# Patient Record
Sex: Male | Born: 1960
Health system: Southern US, Community
[De-identification: ages and names within clinical notes are randomized; demographics above are authoritative.]

---

## 2004-11-17 ENCOUNTER — Emergency Department: Payer: Self-pay | Admitting: Emergency Medicine
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
## 2019-06-30 ENCOUNTER — Ambulatory Visit: Payer: Self-pay | Attending: Internal Medicine

## 2019-06-30 DIAGNOSIS — Z20822 Contact with and (suspected) exposure to covid-19: Secondary | ICD-10-CM | POA: Insufficient documentation

## 2019-07-01 ENCOUNTER — Ambulatory Visit: Payer: Self-pay | Admitting: Family Medicine

## 2019-07-01 LAB — NOVEL CORONAVIRUS, NAA: SARS-CoV-2, NAA: NOT DETECTED

## 2019-07-07 DIAGNOSIS — R030 Elevated blood-pressure reading, without diagnosis of hypertension: Secondary | ICD-10-CM | POA: Diagnosis not present

## 2019-07-07 DIAGNOSIS — U071 COVID-19: Secondary | ICD-10-CM | POA: Diagnosis not present

## 2019-07-24 LAB — LIPID PANEL
Cholesterol: 161 (ref 0–200)
HDL: 60 (ref 35–70)
LDL Cholesterol: 73
LDl/HDL Ratio: 3.1
Triglycerides: 237 — AB (ref 40–160)
Triglycerides: 237 — AB (ref 40–160)

## 2019-07-31 ENCOUNTER — Ambulatory Visit: Payer: Self-pay | Admitting: Internal Medicine

## 2019-08-04 NOTE — Progress Notes (Signed)
Patient ID: Gregory Salinas, male    DOB: 05/05/1961, 59 y.o.   MRN: [id number]  PCP: Jamelle Haring, MD  Chief Complaint  Patient presents with  . New Patient (Initial Visit)  . Establish Care  . Hypertension    Subjective:   Gregory Salinas is a 59 y.o. male, presents to clinic with CC of the following:  Chief Complaint  Patient presents with  . New Patient (Initial Visit)  . Establish Care  . Hypertension    HPI:  Patient is a 59 year old male who presents today new to the practice. He has a history of hypertension and hyperlipidemia, and also a knee injury (hyperextension) when much younger on limited documentation available to review from his PMH.  HTN  Reviewed medication regimen, on zestoretic 20-25 mg daily. Is taking BP med and not missing doses  BP Readings from Last 3 Encounters:  08/05/19 (!) 156/90   Has a clinic at work again, at [employer]. Was closed down past year before reopening again.  They have been helping to follow his blood pressures in the past. Has checked BP's at home and running in the 160/100  range on home checks. He brought in his monitor, and on checking in the office, it tends to run about 10 mm of both what we were getting on manual check today.  The home readings are still higher than desired. Weight has in general remained stable, increased some in recent past as noted below No CP, SOB, marked fatigue, increased HA's, vision changes, LE swelling Has eye exam yearly.   Diet  - trying to eat healthy at times (DASH diet, low sodium intake discussed),he likes butter, 3 monster drinks a week noted and trying to cut back. Physical activity level - minimal, walk the plant, no regular exercise   Hyperlipidemia: Reviewed Current Medication -  on a statin, Zocor 80 mg daily Not missing doses.  - Current Diet: not adhere to very strict diet rec's for limiting fats - No recent chest pain, shortness of breath, myalgias. Last  Lipids: Triglycerides 237, LDL 75 on check in March, 5366  Obesity Wt Readings from Last 3 Encounters:  08/05/19 203 lb 1.6 oz (92.1 kg)  weight around 195 past 10 years+, a few pounds up recent past   Possible sleep apnea -his wife is concerned as on a note with his records, she noted Gregory Salinas has many apneas each night, lasting at least 10+ seconds to over 1 minute with very little time between them for at least 10 or more minutes or until he changes position.  She noted she is concerned, but he refuses to get a sleep study. An Epworth sleepiness scale completed today was 4. He denies any daytime somnolence, wakes up feeling refreshed.  He notes a has had contact with others with sleep apnea, and does not feel he has same symptoms. Did discuss may be surprised if he does have sleep apnea and CPAP initiated how much better he feels and did not realize that.   He still does not want to get a sleep study at this point, and will continue to monitor.  Prediabetes Glucose was 120, A1c was 6.1 on recent check in March.  Denied a history of prediabetes.  Gets up once a night to urinate, and denied frequency, marked fatigue, or marked increased thirst.  Bone spur - posterior upper calf He noted a firm area in his right posterior knee/upper calf region, not painful,  no problems with knee motion, and saw [facility] in 2018 for this, an x-ray was completed which he brought with him and reviewed.  He was told it was a spur/osteophyte with no further recommendations.  He wanted to bring attention today.  He notes he remains relatively asymptomatic. Hyperextended knee with karate test age 68 in his past Using MSN to help with his joints, meloxicam no longer taking and does use an occasional ibuprofen or Tylenol product as needed.  He has been doing pretty well in the recent past.  He noted a right thumb numbness at the base, all the time since Nov 2019 Right handed, no weakness, not dropping things, not  limiting.  He is on his computer often at work, and tries to follow good ergonomics.  Not painful. After had neck pain, pinched nerve was diagnosed in his neck.  Tob - never smoker Alcohol - neg  Colonoscopy hx - 02/05/12, hemorrhoids noted, no other concerns. Prostate history - has had examined in past and ok, PSA's good to date  Due for 2nd Covid shortly   Current Outpatient Medications:  .  Ascorbic Acid (VITAMIN C) 1000 MG tablet, Take 1,000 mg by mouth daily., Disp: , Rfl:  .  aspirin 81 MG chewable tablet, , Disp: , Rfl:  .  B Complex-C (SUPER B COMPLEX PO), , Disp: , Rfl:  .  Cholecalciferol (HM VITAMIN D3) 25 MCG (1000 UT) tablet, Take 1,000 Units by mouth daily., Disp: , Rfl:  .  cyclobenzaprine (FLEXERIL) 10 MG tablet, Take 10 mg by mouth 3 (three) times daily as needed for muscle spasms., Disp: , Rfl:  .  Doxylamine Succinate, Sleep, (SLEEP AID PO), Take 10 mg by mouth. [place], Disp: , Rfl:  .  Glucosamine-Chondroitin-MSM 1500-1200-500 MG PACK, Take by mouth., Disp: , Rfl:  .  meloxicam (MOBIC) 15 MG tablet, Take 15 mg by mouth as needed for pain. As need for knee pain, Disp: , Rfl:  .  Multiple Vitamins-Minerals (MULTIVITAMIN GUMMIES WOMENS PO), , Disp: , Rfl:  .  simvastatin (ZOCOR) 80 MG tablet, Take 80 mg by mouth daily., Disp: , Rfl:  .  traMADol (ULTRAM) 50 MG tablet, , Disp: , Rfl:  .  vitamin E (VITAMIN E) 180 MG (400 UNITS) capsule, Take 800 Units by mouth daily., Disp: , Rfl:  .  lisinopril-hydrochlorothiazide (ZESTORETIC) 20-12.5 MG tablet, Take 2 tablets by mouth daily., Disp: 90 tablet, Rfl: 3   No Known Allergies   History reviewed. No pertinent family history.   Social History   Tobacco Use  . Smoking status: Never Smoker  . Smokeless tobacco: Never Used  Substance Use Topics  . Alcohol use: Never    With staff assistance, above reviewed with the patient today.  ROS:  As per HPI Constitutional: Negative for fever or other Covid concerning  symptoms,  Respiratory: Negative for cough. shortness of breath.   Cardiovascular: Negative for chest pain or palpitations. No increased LE swelling Gastrointestinal: Negative for persistent abdominal pain, no concerning  bowel changes.  No black stools or bleeding per rectum Musculoskeletal: Negative for joint swelling/pains, positive numbness in the right hand, gait problems Skin: Negative for new rash Neurological: Negative for increased headache, no increased numbness/tingling/weakness in the lower extremities  No other specific complaints on sytems review (except as listed in HPI above).   PHQ2/9: Depression screen [facility] 2/9 08/05/2019  Decreased Interest 0  Down, Depressed, Hopeless 0  PHQ - 2 Score 0  Altered sleeping  1  Tired, decreased energy 0  Change in appetite 0  Feeling bad or failure about yourself  0  Trouble concentrating 0  Moving slowly or fidgety/restless 0  Suicidal thoughts 0  PHQ-9 Score 1  Difficult doing work/chores Not difficult at all   PHQ-2/9 Result is neg  Fall Risk: Fall Risk  08/05/2019  Falls in the past year? 0  Number falls in past yr: 0  Injury with Fall? 0      Objective:   Vitals:   08/05/19 0853  BP: (!) 156/90  Pulse: 86  Resp: 20  Temp: (!) 97.1 F (36.2 C)  TempSrc: Temporal  SpO2: 96%  Weight: 203 lb 1.6 oz (92.1 kg)  Height: 5\' 3"  (1.6 m)    Body mass index is 35.98 kg/m.  Physical Exam   NAD, masked, obese HEENT - [place]/AT, sclera anicteric, positive glasses, PERRL, EOMI, conj - non-inj'ed, TM's and canals clear, pharynx clear Neck - supple, no adenopathy, no TM, carotids 2+ and = without bruits bilat Car - RRR without m/g/r Pulm- RR and effort normal at rest, CTA without wheeze or rales Abd - soft, obese, NT, ND, BS+,  no masses, no HSM Back - no CVA tenderness, no spinal tenderness GU -deferred, last PSA was good, no concerning recent symptoms noted. Skin- no rash noted on exposed areas,  Ext - no LE edema, no  active joints Right knee -a firm nodularity was noted posterior to the right knee in the very proximal upper calf region that was nontender, no erythema or overlying skin changes evident, He had good range of motion of his right knee with no discomfort on testing, no right knee swelling. Right hand -good right hand grip strength, good pinch strength with thumb to the little finger, good range of motion of the right thumb, his sensation to light touch in the right hand area was normal and equal to the opposite hand.  No bruising, swelling, erythema.  Right wrist motion was good.  Pulses good distally. Neuro/psychiatric - affect was not flat, appropriate with conversation  Alert and oriented  Grossly non-focal - good strength on testing extremities, sensation intact to LT in distal extremities  Speech and gait are normal    Results for orders placed or performed in visit on 06/30/19  Novel Coronavirus, NAA ([facility])   Specimen: Nasopharyngeal(NP) swabs in vial transport medium   NASOPHARYNGE  TESTING  Result Value Ref Range   SARS-CoV-2, NAA Not Detected Not Detected   He brought a folder of recent lab test done from 07/30/2019.  Abnormalities included a glucose of 120, slightly low sodium and chloride level at 134 and 97, a hemoglobin A1c of 6.1, triglyceride level of 237 with the LDL cholesterol ok at 75, PSA was 1, testosterone was slightly low at 184. TSH was normal at 1.45 and vitamin D level was normal at 65    Assessment & Plan:  1. Essential hypertension Patient's blood pressure has been high on recent readings at home, and he brought a nice table of the blood pressure readings recorded at home over the past month plus.  He also had laboratory tests recently done and reviewed.  Kidney function was normal as part of those labs.  His monitor was compared to readings here today, and was noted to be about 10 mm higher with readings on the monitor versus manual reading.  Noting this, I still  believe the blood pressures at home are above goal, and not as well  controlled.  I do feel that increasing his blood pressure medication is appropriate to try to get better control, and he agreed. Discussed adding a second medicine versus increasing the Zestoretic medicine dose, and agreed to increase the Zestoretic to 40 mg of the lisinopril and continuing the same dose of the hydrochlorothiazide (40-25 mg dose), which will require taking 2 of the 20-12.5 mg tablets daily.  After taking the new dose for approximately 5 to 7 days, will again record readings at home and bring with him at the next visit. Also discussed the importance of weight management and ideally could lose a few pounds encouraged to help with blood pressure control, and also spent time discussing diet recommendations and increasing physical activity levels as well. - lisinopril-hydrochlorothiazide (ZESTORETIC) 20-12.5 MG tablet; Take 2 tablets by mouth daily.  Dispense: 90 tablet; Refill: 3  2. Mixed hyperlipidemia We will continue the Zocor, with a goal of an LDL < 70 felt best. As above, dietary recommendations and increasing physical activity levels were discussed to help as well.  3. Class 2 severe obesity due to excess calories with serious comorbidity and body mass index (BMI) of 35.0 to 35.9 in adult [facility](HCC) As above, I did discuss the importance of weight management to help with the conditions noted today.  Ideally, even losing a few pounds would be encouraged and helpful.  4. Prediabetes Educated, and did include information on this in the AVS Again, dietary recommendations and increasing physical activity levels can be helpful and were discussed. Did mention the possibility of Metformin and its use and prediabetes, although he very much did not want to start another medicine like Metformin today and would prefer the lifestyle modifications in helping to prevent progression to diabetes presently.  5. Bone spur history Do  feel of the area in the upper right calf is benign, and may be a spurring/osteophyte type process, or possibly could be calcification of the soft tissue in this area like a myositis ossificans.  It is not new, and has not changed significantly in the recent past, and is not causing him symptoms presently.  We will continue to monitor, and it does become more problematic over time, will repeat an x-ray at that time.  6. Neuropathy of right hand -mild Discussed the possible sources of his right hand symptoms, which are not marked or limiting at this point, and he still has good very good strength. This has been present since November 2019 per his report. Recommended trying to avoid any prolonged positions in the extremes of wrist flexion and extension, and will hold off on formal splints at this time, often start with night splints, as he to immobilize the wrist significantly due to some muscle atrophy and weakness that can develop over time.  May pursue if symptoms become more problematic.  Also may pursue further studies.  Emphasized the importance of good ergonomics treated with his computer at work given his frequent use of that presently.  7. Hemorrhoids, unspecified hemorrhoid type Noted on the prior colonoscopy.  No major concerns clinically noted.  8.  Possible sleep apnea His Epworth sleepiness scale was in the normal range (four), and he denied marked symptoms of not feeling refreshed after sleeping or having daytime somnolence.  His wife echoed concerns with witnessing apnea when he sleeps.  We discussed the potential of a sleep study, and now they can be done at home, and the potential likelihood that he has sleep apnea also discussed.  I noted if  he does have sleep apnea, it would be beneficial to diagnose and treat to lessen potential exacerbation of health concerns in the future.  He was not interested in pursuing the testing at this point.    He will follow up in approximately 6 weeks  time, sooner as needed. He did bring a folder with all tests to review, and that was very helpful for this visit. The folder was returned to him, and asked that he hang onto this folder for any future reference needs.   Jamelle Haring, MD 08/05/19 10:52 AM

## 2019-08-05 ENCOUNTER — Ambulatory Visit (INDEPENDENT_AMBULATORY_CARE_PROVIDER_SITE_OTHER): Payer: BC Managed Care – PPO | Admitting: Internal Medicine

## 2019-08-05 ENCOUNTER — Other Ambulatory Visit: Payer: Self-pay

## 2019-08-05 ENCOUNTER — Encounter: Payer: Self-pay | Admitting: Internal Medicine

## 2019-08-05 VITALS — BP 156/90 | HR 86 | Temp 97.1°F | Resp 20 | Ht 63.0 in | Wt 203.1 lb

## 2019-08-05 DIAGNOSIS — E782 Mixed hyperlipidemia: Secondary | ICD-10-CM | POA: Diagnosis not present

## 2019-08-05 DIAGNOSIS — R7303 Prediabetes: Secondary | ICD-10-CM

## 2019-08-05 DIAGNOSIS — M779 Enthesopathy, unspecified: Secondary | ICD-10-CM

## 2019-08-05 DIAGNOSIS — K649 Unspecified hemorrhoids: Secondary | ICD-10-CM | POA: Insufficient documentation

## 2019-08-05 DIAGNOSIS — Z6835 Body mass index (BMI) 35.0-35.9, adult: Secondary | ICD-10-CM

## 2019-08-05 DIAGNOSIS — G5691 Unspecified mononeuropathy of right upper limb: Secondary | ICD-10-CM | POA: Insufficient documentation

## 2019-08-05 DIAGNOSIS — I10 Essential (primary) hypertension: Secondary | ICD-10-CM

## 2019-08-05 HISTORY — DX: Unspecified hemorrhoids: K64.9

## 2019-08-05 MED ORDER — LISINOPRIL-HYDROCHLOROTHIAZIDE 20-12.5 MG PO TABS: 2.0000 | ORAL_TABLET | Freq: Every day | ORAL | 3 refills | Status: DC

## 2019-08-05 NOTE — Patient Instructions (Signed)
Prediabetes Prediabetes is the condition of having a blood sugar (blood glucose) level that is higher than it should be, but not high enough for you to be diagnosed with type 2 diabetes. Having prediabetes puts you at risk for developing type 2 diabetes (type 2 diabetes mellitus). Prediabetes may be called impaired glucose tolerance or impaired fasting glucose. Prediabetes usually does not cause symptoms. Your health care provider can diagnose this condition with blood tests. You may be tested for prediabetes if you are overweight and if you have at least one other risk factor for prediabetes. What is blood glucose, and how is it measured? Blood glucose refers to the amount of glucose in your bloodstream. Glucose comes from eating foods that contain sugars and starches (carbohydrates), which the body breaks down into glucose. Your blood glucose level may be measured in mg/dL (milligrams per deciliter) or mmol/L (millimoles per liter). Your blood glucose may be checked with one or more of the following blood tests:  A fasting blood glucose (FBG) test. You will not be allowed to eat (you will fast) for 8 hours or longer before a blood sample is taken. ? A normal range for FBG is 70-100 mg/dl (3.9-5.6 mmol/L).  An A1c (hemoglobin A1c) blood test. This test provides information about blood glucose control over the previous 2?3months.  An oral glucose tolerance test (OGTT). This test measures your blood glucose at two times: ? After fasting. This is your baseline level. ? Two hours after you drink a beverage that contains glucose. You may be diagnosed with prediabetes:  If your FBG is 100?125 mg/dL (5.6-6.9 mmol/L).  If your A1c level is 5.7?6.4%.  If your OGTT result is 140?199 mg/dL (7.8-11 mmol/L). These blood tests may be repeated to confirm your diagnosis. How can this condition affect me? The pancreas produces a hormone (insulin) that helps to move glucose from the bloodstream into cells.  When cells in the body do not respond properly to insulin that the body makes (insulin resistance), excess glucose builds up in the blood instead of going into cells. As a result, high blood glucose (hyperglycemia) can develop, which can cause many complications. Hyperglycemia is a symptom of prediabetes. Having high blood glucose for a long time is dangerous. Too much glucose in your blood can damage your nerves and blood vessels. Long-term damage can lead to complications from diabetes, which may include:  Heart disease.  Stroke.  Blindness.  Kidney disease.  Depression.  Poor circulation in the feet and legs, which could lead to surgical removal (amputation) in severe cases. What can increase my risk? Risk factors for prediabetes include:  Having a family member with type 2 diabetes.  Being overweight or obese.  Being older than age 45.  Being of American Indian, African-American, Hispanic/Latino, or Asian/Pacific Islander descent.  Having an inactive (sedentary) lifestyle.  Having a history of heart disease.  History of gestational diabetes or polycystic ovary syndrome (PCOS), in women.  Having low levels of good cholesterol (HDL-C) or high levels of blood fats (triglycerides).  Having high blood pressure. What actions can I take to prevent diabetes?      Be physically active. ? Do moderate-intensity physical activity for 30 or more minutes on 5 or more days of the week, or as much as told by your health care provider. This could be brisk walking, biking, or water aerobics. ? Ask your health care provider what activities are safe for you. A mix of physical activities may be best, such as   walking, swimming, cycling, and strength training.  Lose weight as told by your health care provider. ? Losing 5-7% of your body weight can reverse insulin resistance. ? Your health care provider can determine how much weight loss is best for you and can help you lose weight  safely.  Follow a healthy meal plan. This includes eating lean proteins, complex carbohydrates, fresh fruits and vegetables, low-fat dairy products, and healthy fats. ? Follow instructions from your health care provider about eating or drinking restrictions. ? Make an appointment to see a diet and nutrition specialist (registered dietitian) to help you create a healthy eating plan that is right for you.  Do not smoke or use any tobacco products, such as cigarettes, chewing tobacco, and e-cigarettes. If you need help quitting, ask your health care provider.  Take over-the-counter and prescription medicines as told by your health care provider. You may be prescribed medicines that help lower the risk of type 2 diabetes.  Keep all follow-up visits as told by your health care provider. This is important. Summary  Prediabetes is the condition of having a blood sugar (blood glucose) level that is higher than it should be, but not high enough for you to be diagnosed with type 2 diabetes.  Having prediabetes puts you at risk for developing type 2 diabetes (type 2 diabetes mellitus).  To help prevent type 2 diabetes, make lifestyle changes such as being physically active and eating a healthy diet. Lose weight as told by your health care provider. This information is not intended to replace advice given to you by your health care provider. Make sure you discuss any questions you have with your health care provider. Document Revised: 08/30/2018 Document Reviewed: 06/29/2015 [employer] Patient Education  2020 [employer]  

## 2019-09-15 NOTE — Progress Notes (Signed)
Patient ID: Gregory Salinas, male    DOB: Aug 24, 1960, 59 y.o.   MRN: [id number]  PCP: Jamelle Haring, MD  Chief Complaint  Patient presents with  . Hypertension    follow up    Subjective:   Gregory Salinas is a 59 y.o. male, presents to clinic with CC of the following:  Chief Complaint  Patient presents with  . Hypertension    follow up    HPI:  Patient is a 59 year old male who presented 08/05/2019 new to the practice. His blood pressure was noted to be not as well controlled last visit, with his blood pressure medicine slightly increased. Follows up today.  HTN medication regimen, on zestoretic 20-12.5 mg - two tabs daily (increased from 20/25 mg dose daily prior) Is taking BP med and not missing doses  BP Readings from Last 3 Encounters:  09/16/19 132/76  08/05/19 (!) 156/90   He has checked BP's at home and brought the recordings on a sheet today with daily readings noted from March 29 to April 26. Have been running in the 140-160/70's-91 range recently.  Last visit when he brought his monitor with him, his home cuff was about 47mm above our reading.  There was a question of the potential cuff size playing a role with that. Also, he noted he has a clinic at work again, at [employer]. Was closed down past year before reopening again.  They had been helping to follow his blood pressures in the past, although he has not returned to them as of yet to check his blood pressure at work.  Weight had increased some in the recent past, stable this past month. Wt Readings from Last 3 Encounters:  09/16/19 204 lb 1.6 oz (92.6 kg)  08/05/19 203 lb 1.6 oz (92.1 kg)   No CP, palp's, SOB, marked fatigue,increased HA's, vision changes, LE swelling  Diet and physical activity levels as noted on last visit.  Hyperlipidemia: Current Medication -  on a statin, Zocor 80 mg daily - No chest pain, shortness of breath, myalgias.  Last Lipids: Triglycerides 237,  LDL 75 on check in March, 5465  Obesity Wt Readings from Last 3 Encounters:  09/16/19 204 lb 1.6 oz (92.6 kg)  08/05/19 203 lb 1.6 oz (92.1 kg)    weight around 195 past 10 years+, a few pounds up recent past and weight stable this past month.   Possible sleep apnea -his wife noted concern as on a note with his records reviewed last visit noted Gregory Salinas has many apneas each night, lasting at least 10+ seconds to over 1 minute with very little time between them for at least 10 or more minutes or until he changes position.  She noted she is concerned, but he has refused to get a sleep study. An Epworth sleepiness scale completed last visit was 4. He continues to deny any daytime somnolence, wakes up feeling refreshed.   He noted previoulsy has had contact with others with sleep apnea, and does not feel he has the same symptoms. Did discuss prior that he may be surprised if he does have sleep apnea and CPAP initiated how much better he feels and did not realize that.   He still does not want to pursue a sleep study at this time.  Prediabetes Glucose was 120, A1c was 6.1 on recent check in March.   Denied a history of prediabetes.  Gets up once a night to urinate, and denied frequency,  marked fatigue, or marked increased thirst.   Tob - never smoker Alcohol - neg  Colonoscopy hx - 02/05/12, hemorrhoids noted, no other concerns        Patient Active Problem List   Diagnosis Date Noted  . Essential hypertension 08/05/2019  . Mixed hyperlipidemia 08/05/2019  . Class 2 severe obesity due to excess calories with serious comorbidity and body mass index (BMI) of 35.0 to 35.9 in adult (HCC) 08/05/2019  . Prediabetes 08/05/2019  . Neuropathy of right hand 08/05/2019  . Hemorrhoids 08/05/2019      Current Outpatient Medications:  .  Ascorbic Acid (VITAMIN C) 1000 MG tablet, Take 1,000 mg by mouth daily., Disp: , Rfl:  .  aspirin 81 MG chewable tablet, , Disp: , Rfl:  .  B  Complex-C (SUPER B COMPLEX PO), , Disp: , Rfl:  .  Cholecalciferol (HM VITAMIN D3) 25 MCG (1000 UT) tablet, Take 1,000 Units by mouth daily., Disp: , Rfl:  .  Doxylamine Succinate, Sleep, (SLEEP AID PO), Take 10 mg by mouth. [place], Disp: , Rfl:  .  Glucosamine-Chondroitin-MSM 1500-1200-500 MG PACK, Take by mouth., Disp: , Rfl:  .  lisinopril-hydrochlorothiazide (ZESTORETIC) 20-12.5 MG tablet, Take 2 tablets by mouth daily., Disp: 90 tablet, Rfl: 3 .  Multiple Vitamins-Minerals (MULTIVITAMIN GUMMIES WOMENS PO), , Disp: , Rfl:  .  simvastatin (ZOCOR) 80 MG tablet, Take 80 mg by mouth daily., Disp: , Rfl:  .  vitamin E (VITAMIN E) 180 MG (400 UNITS) capsule, Take 800 Units by mouth daily., Disp: , Rfl:  .  cyclobenzaprine (FLEXERIL) 10 MG tablet, Take 10 mg by mouth 3 (three) times daily as needed for muscle spasms., Disp: , Rfl:  .  meloxicam (MOBIC) 15 MG tablet, Take 15 mg by mouth as needed for pain. As need for knee pain, Disp: , Rfl:  .  traMADol (ULTRAM) 50 MG tablet, , Disp: , Rfl:    No Known Allergies   No past surgical history on file.   No family history on file.   Social History   Tobacco Use  . Smoking status: Never Smoker  . Smokeless tobacco: Never Used  Substance Use Topics  . Alcohol use: Never    With staff assistance, above reviewed with the patient today.  ROS: As per HPI, otherwise no specific complaints on a limited and focused system review   No results found for this or any previous visit (from the past 72 hour(s)).   PHQ2/9: Depression screen [facility] 2/9 09/16/2019 08/05/2019  Decreased Interest 0 0  Down, Depressed, Hopeless 0 0  PHQ - 2 Score 0 0  Altered sleeping 0 1  Tired, decreased energy 0 0  Change in appetite 0 0  Feeling bad or failure about yourself  0 0  Trouble concentrating 0 0  Moving slowly or fidgety/restless 0 0  Suicidal thoughts 0 0  PHQ-9 Score 0 1  Difficult doing work/chores Not difficult at all Not difficult at all    PHQ-2/9 Result is neg   Fall Risk: Fall Risk  09/16/2019 08/05/2019  Falls in the past year? 0 0  Number falls in past yr: 0 0  Injury with Fall? 0 0  Follow up Falls evaluation completed -      Objective:   Vitals:   09/16/19 0806  BP: 132/76  Pulse: 88  Resp: 16  Temp: (!) 97.3 F (36.3 C)  TempSrc: Temporal  SpO2: 98%  Weight: 204 lb 1.6 oz (92.6 kg)  Height: 5\' 3"  (1.6 m)    Body mass index is 36.15 kg/m. Blood pressure check by me in the office using a large adult cuff: Right arm-150/88 Left arm-155/90  Physical Exam   NAD, masked, pleasant HEENT - [place]/AT, sclera anicteric, conj - non-inj'ed, Neck - supple, carotids 2+ and = without bruits bilat Car - RRR without m/g/r Pulm- RR and effort normal at rest, CTA without wheeze or rales Abd - soft, obese, NT diffusely,  Back - no CVA tenderness Ext - no LE edema,  Neuro/psychiatric - affect was not flat, appropriate with conversation  Alert and oriented  Speech  normal   Results for orders placed or performed in visit on 06/30/19  Novel Coronavirus, NAA ([facility])   Specimen: Nasopharyngeal(NP) swabs in vial transport medium   NASOPHARYNGE  TESTING  Result Value Ref Range   SARS-CoV-2, NAA Not Detected Not Detected         Assessment & Plan:   1. Essential hypertension Noted it is likely his blood pressure is running in the 80-90 range on average, with some concerns it may be running a little higher than the goal of less than 140 systolic.  Ideally, trying to get the blood pressure close to 120/80 is the goal. Noted if trying to get slightly better control, would be more advantageous adding a another medicine then trying to increase the current medicine he is on to a higher dose.  Ill and amlodipine product would be a reasonable medicine to add if was going to. He is very diligent with getting outside blood pressure measurements to help with data points in decision making, and noting may be slightly higher  than what we have been getting in the office is 1 consideration. Mutually agreed to follow-up and get a blood pressure check with his clinic at work this Thursday as he was planning, and seeing how their blood pressure check compared with his home readings as well.  We will also continue to have home blood pressure checks as he is doing on his current medication dose. We will follow up with a phone communication in a couple weeks time, and pending further data points received, may decide to add add amlodipine product.  He was understanding of this and in agreement with this approach. He did just have labs in March, and no need to repeat presently - lisinopril-hydrochlorothiazide (ZESTORETIC) 20-12.5 MG tablet; Take 2 tablets by mouth daily.  Dispense: 180 tablet; Refill: 3  2. Mixed hyperlipidemia Last lipid panel from March was reviewed, and remains on a statin presently.  3. Class 2 severe obesity due to excess calories with serious comorbidity and body mass index (BMI) of 35.0 to 35.9 in adult (HCC) Weight has remained stable this past month. Continued weight management importance noted today, and continue to monitor.  4. Prediabetes Noted last lab checks in March, no concerning symptoms noted in the recent past, will continue to monitor, with recheck labs on follow-up visit. Last visit did mention the Metformin possibility with the use and prediabetes, although he did not want to start another medicine like Metformin at that point and really wanted to try lifestyle modifications to help in trying to prevent progression to diabetes   5.  Possible sleep apnea His Epworth sleepiness scale was in the normal range (four) last visit, and he denied marked symptoms of not feeling refreshed after sleeping or having daytime somnolence.  His wife echoed concerns with witnessing apnea when he sleeps.    Last  visit I discussed the potential of a sleep study, and now they can be done at home, and the  potential likelihood that he has sleep apnea also discussed.  I noted if he does have sleep apnea, it would be beneficial to diagnose and treat to lessen potential exacerbation of health concerns in the future.  He remains not interested in pursuing further testing at this point.   Will have a communication in a couple weeks time to help assess the potential need of another medicine for blood pressure control, and tentatively have scheduled a follow-up for approximately 4 months time, sooner as needed. Will recheck labs at that visit, and he noted today that he can get labs at his work clinic for free, and was not sure the cost of labs done through our office, and he would likely prefer to continuing to get labs done at his work.   Towanda Malkin, MD 09/16/19 8:21 AM

## 2019-09-16 ENCOUNTER — Ambulatory Visit (INDEPENDENT_AMBULATORY_CARE_PROVIDER_SITE_OTHER): Payer: BC Managed Care – PPO | Admitting: Internal Medicine

## 2019-09-16 ENCOUNTER — Encounter: Payer: Self-pay | Admitting: Internal Medicine

## 2019-09-16 ENCOUNTER — Other Ambulatory Visit: Payer: Self-pay

## 2019-09-16 VITALS — BP 132/76 | HR 88 | Temp 97.3°F | Resp 16 | Ht 63.0 in | Wt 204.1 lb

## 2019-09-16 DIAGNOSIS — I10 Essential (primary) hypertension: Secondary | ICD-10-CM | POA: Diagnosis not present

## 2019-09-16 DIAGNOSIS — Z6835 Body mass index (BMI) 35.0-35.9, adult: Secondary | ICD-10-CM

## 2019-09-16 DIAGNOSIS — E782 Mixed hyperlipidemia: Secondary | ICD-10-CM | POA: Diagnosis not present

## 2019-09-16 DIAGNOSIS — R7303 Prediabetes: Secondary | ICD-10-CM

## 2019-09-16 MED ORDER — LISINOPRIL-HYDROCHLOROTHIAZIDE 20-12.5 MG PO TABS: 2.0000 | ORAL_TABLET | Freq: Every day | ORAL | 3 refills | Status: DC

## 2019-10-01 ENCOUNTER — Telehealth: Payer: Self-pay | Admitting: Internal Medicine

## 2019-10-01 DIAGNOSIS — I10 Essential (primary) hypertension: Secondary | ICD-10-CM

## 2019-10-01 MED ORDER — AMLODIPINE BESYLATE 5 MG PO TABS: 5.0000 mg | ORAL_TABLET | Freq: Every day | ORAL | 3 refills | Status: DC

## 2019-10-01 NOTE — Telephone Encounter (Signed)
I phoned the patient this morning for follow-up as was planned after our last visit. He had dropped off some recent blood pressure checks as well to review. His blood pressures at the end of April into the beginnings of May have not been very well controlled, with the vast majority of readings over that time having diastolic blood pressures in the 90s, and a 100 or 101.  Systolics have been over 140, predominantly in the 150s to low 160s. Discussed adding another medicine at this point, and he agreed to proceed. We will add amlodipine-starting with 5 mg daily, although may need to increase to 10 mg pending his response. He will also continue to take his Zestoretic product, and asked him to take 1 in the morning, and a second dose later in the day, and he notes he can do so in the 5:55 PM timeframe, as his blood pressure seemed to be more elevated in the early morning checks.  Strongly encouraged not to take the second dose closer to bedtime, as the diuretic component may keep him up at night, although it is a very low diuretic dose as part of that medicine.  He was advised to take a week off from checking his blood pressures, and then can return to checking and if not improving over the next few weeks, he will contact us sooner than his follow-up planned in a few months time after our last visit.

## 2019-12-17 ENCOUNTER — Other Ambulatory Visit: Payer: Self-pay | Admitting: Internal Medicine

## 2019-12-17 DIAGNOSIS — I10 Essential (primary) hypertension: Secondary | ICD-10-CM

## 2019-12-17 MED ORDER — AMLODIPINE BESYLATE 10 MG PO TABS: 10.0000 mg | ORAL_TABLET | Freq: Every day | ORAL | 3 refills | Status: DC

## 2019-12-17 NOTE — Progress Notes (Signed)
Patient sent blood pressure readings from home to review. In the month of July, they have been running predominantly in the 140-160/ 90s I do think increasing the amlodipine product to 10 mg daily is indicated. He can take 2 tablets of the 5 mg until he runs out and then will have a prescription for 10 mg at the pharmacy to start. He should continue to take blood pressure measurements and record.

## 2019-12-18 NOTE — Progress Notes (Signed)
I called the patient and there was no answer, I left a voicemail. The patient does prefer to communicate through Mychart so a message was sent to him via Mychart as well.

## 2020-01-01 ENCOUNTER — Encounter: Payer: Self-pay | Admitting: Internal Medicine

## 2020-01-31 LAB — LIPID PANEL
Cholesterol: 161 (ref 0–200)
HDL: 60 (ref 35–70)
LDL Cholesterol: 73
LDl/HDL Ratio: 2.7
Triglycerides: 180 — AB (ref 40–160)

## 2020-01-31 LAB — BASIC METABOLIC PANEL
BUN: 18 (ref 4–21)
CO2: 21 (ref 13–22)
Chloride: 97 — AB (ref 99–108)
Creatinine: 0.9 (ref 0.6–1.3)
Glucose: 66
Potassium: 3.9 (ref 3.4–5.3)
Sodium: 136 — AB (ref 137–147)

## 2020-01-31 LAB — TESTOSTERONE: Testosterone: 210

## 2020-01-31 LAB — HEPATIC FUNCTION PANEL
ALT: 26 (ref 10–40)
AST: 22 (ref 14–40)
Alkaline Phosphatase: 52 (ref 25–125)
Bilirubin, Total: 0.5

## 2020-01-31 LAB — TSH: TSH: 1.82 (ref 0.41–5.90)

## 2020-01-31 LAB — PSA: PSA: 1.2

## 2020-01-31 LAB — COMPREHENSIVE METABOLIC PANEL
Albumin: 4.3 (ref 3.5–5.0)
Calcium: 9.1 (ref 8.7–10.7)
GFR calc Af Amer: 111
GFR calc non Af Amer: 95
Globulin: 2.7

## 2020-01-31 LAB — HEMOGLOBIN A1C: Hemoglobin A1C: 6.1

## 2020-01-31 LAB — VITAMIN D 25 HYDROXY (VIT D DEFICIENCY, FRACTURES): Vit D, 25-Hydroxy: 58

## 2020-02-18 ENCOUNTER — Encounter: Payer: Self-pay | Admitting: Internal Medicine

## 2020-03-17 ENCOUNTER — Encounter: Payer: Self-pay | Admitting: Internal Medicine

## 2020-08-13 ENCOUNTER — Telehealth: Payer: Self-pay

## 2020-08-13 DIAGNOSIS — I10 Essential (primary) hypertension: Secondary | ICD-10-CM

## 2020-08-13 MED ORDER — LISINOPRIL-HYDROCHLOROTHIAZIDE 20-12.5 MG PO TABS: 2.0000 | ORAL_TABLET | Freq: Every day | ORAL | 0 refills | Status: DC

## 2020-08-16 NOTE — Telephone Encounter (Signed)
lvm to inform pt of refill and to schedule an appt

## 2020-08-30 ENCOUNTER — Telehealth: Payer: Self-pay | Admitting: Family Medicine

## 2020-08-30 DIAGNOSIS — I10 Essential (primary) hypertension: Secondary | ICD-10-CM

## 2020-08-31 NOTE — Telephone Encounter (Signed)
lvm for pt to call the office and schedule an appt 

## 2020-09-10 ENCOUNTER — Telehealth: Payer: Self-pay | Admitting: Internal Medicine

## 2020-09-10 DIAGNOSIS — I10 Essential (primary) hypertension: Secondary | ICD-10-CM

## 2020-09-10 NOTE — Telephone Encounter (Signed)
Medication Refill - Medication: Patient requesting lisinopril-hydrochlorothiazide (ZESTORETIC) 20-12.5 MG tablet , amLODipine (NORVASC) 10 MG tablet , and simvastatin (ZOCOR) 80 MG tablet to hold him over until 10/05/2020 physical appointment  Has the patient contacted their pharmacy? Yes.    (Agent: If yes, when and what did the pharmacy advise?) Contact PCP office  Preferred Pharmacy (with phone number or street name):  [facility] - Adline Peals,  - [address] Phone:  [phone]  Fax:  [phone]      Agent: Please be advised that RX refills may take up to 3 business days. We ask that you follow-up with your pharmacy.

## 2020-09-13 MED ORDER — SIMVASTATIN 80 MG PO TABS: 80.0000 mg | ORAL_TABLET | Freq: Every day | ORAL | 0 refills | Status: DC

## 2020-09-13 MED ORDER — LISINOPRIL-HYDROCHLOROTHIAZIDE 20-12.5 MG PO TABS: 2.0000 | ORAL_TABLET | Freq: Every day | ORAL | 0 refills | Status: DC

## 2020-09-13 MED ORDER — AMLODIPINE BESYLATE 10 MG PO TABS: 10.0000 mg | ORAL_TABLET | Freq: Every day | ORAL | 0 refills | Status: DC

## 2020-09-16 LAB — BASIC METABOLIC PANEL
BUN: 17 (ref 4–21)
CO2: 25 — AB (ref 13–22)
Chloride: 98 — AB (ref 99–108)
Creatinine: 0.8 (ref 0.6–1.3)
Glucose: 11
Potassium: 4 (ref 3.4–5.3)
Sodium: 136 — AB (ref 137–147)

## 2020-09-16 LAB — CBC AND DIFFERENTIAL
HCT: 48 (ref 41–53)
Hemoglobin: 15.5 (ref 13.5–17.5)
Platelets: 229 (ref 150–399)
WBC: 5.5

## 2020-09-16 LAB — VITAMIN D 25 HYDROXY (VIT D DEFICIENCY, FRACTURES): Vit D, 25-Hydroxy: 58

## 2020-09-16 LAB — PSA: PSA: 1.15

## 2020-09-16 LAB — HEMOGLOBIN A1C: Hemoglobin A1C: 6.1

## 2020-09-16 LAB — LIPID PANEL
Cholesterol: 179 (ref 0–200)
HDL: 67 (ref 35–70)
LDL Cholesterol: 84
Triglycerides: 180 — AB (ref 40–160)

## 2020-09-16 LAB — HEPATIC FUNCTION PANEL
ALT: 59 — AB (ref 10–40)
AST: 36 (ref 14–40)
Alkaline Phosphatase: 53 (ref 25–125)
Bilirubin, Total: 0.6

## 2020-09-16 LAB — TSH: TSH: 1.45 (ref 0.41–5.90)

## 2020-09-16 LAB — COMPREHENSIVE METABOLIC PANEL
Albumin: 4.6 (ref 3.5–5.0)
Calcium: 9.3 (ref 8.7–10.7)

## 2020-09-16 LAB — CBC: RBC: 5.44 — AB (ref 3.87–5.11)

## 2020-10-05 ENCOUNTER — Encounter: Payer: Self-pay | Admitting: Family Medicine

## 2020-10-05 ENCOUNTER — Ambulatory Visit (INDEPENDENT_AMBULATORY_CARE_PROVIDER_SITE_OTHER): Payer: BC Managed Care – PPO | Admitting: Family Medicine

## 2020-10-05 ENCOUNTER — Other Ambulatory Visit: Payer: Self-pay

## 2020-10-05 VITALS — BP 136/82 | HR 90 | Temp 98.1°F | Resp 16 | Ht 63.0 in | Wt 202.4 lb

## 2020-10-05 DIAGNOSIS — I10 Essential (primary) hypertension: Secondary | ICD-10-CM

## 2020-10-05 DIAGNOSIS — Z0000 Encounter for general adult medical examination without abnormal findings: Secondary | ICD-10-CM

## 2020-10-05 DIAGNOSIS — R7303 Prediabetes: Secondary | ICD-10-CM

## 2020-10-05 DIAGNOSIS — E782 Mixed hyperlipidemia: Secondary | ICD-10-CM

## 2020-10-05 DIAGNOSIS — Z6835 Body mass index (BMI) 35.0-35.9, adult: Secondary | ICD-10-CM

## 2020-10-05 DIAGNOSIS — R0683 Snoring: Secondary | ICD-10-CM

## 2020-10-05 DIAGNOSIS — R29818 Other symptoms and signs involving the nervous system: Secondary | ICD-10-CM

## 2020-10-05 DIAGNOSIS — Z1159 Encounter for screening for other viral diseases: Secondary | ICD-10-CM

## 2020-10-05 DIAGNOSIS — Z125 Encounter for screening for malignant neoplasm of prostate: Secondary | ICD-10-CM

## 2020-10-05 DIAGNOSIS — R7989 Other specified abnormal findings of blood chemistry: Secondary | ICD-10-CM | POA: Insufficient documentation

## 2020-10-05 DIAGNOSIS — Z114 Encounter for screening for human immunodeficiency virus [HIV]: Secondary | ICD-10-CM

## 2020-10-05 MED ORDER — LISINOPRIL-HYDROCHLOROTHIAZIDE 20-12.5 MG PO TABS: 2.0000 | ORAL_TABLET | Freq: Every day | ORAL | 3 refills | Status: DC

## 2020-10-05 MED ORDER — SIMVASTATIN 80 MG PO TABS: 80.0000 mg | ORAL_TABLET | Freq: Every day | ORAL | 3 refills | Status: AC

## 2020-10-05 MED ORDER — AMLODIPINE BESYLATE 10 MG PO TABS
10.0000 mg | ORAL_TABLET | Freq: Every day | ORAL | 3 refills | Status: DC
Start: 2020-10-05 — End: 2021-10-19

## 2020-10-05 NOTE — Patient Instructions (Addendum)
Health Maintenance  Topic Date Due  . Hepatitis C Screening: USPSTF Recommendation to screen - Ages 57-60 yo.  Never done  . Flu Shot  12/20/2020  . Colon Cancer Screening  02/04/2022  . Tetanus Vaccine  08/09/2027  . COVID-19 Vaccine  Completed  . HIV Screening  Completed  . HPV Vaccine  Aged Out   Get testosterone labs done fasting between 8 and 10 am in the morning  Preventive Care 42-75 Years Old, Male Preventive care refers to lifestyle choices and visits with your health care provider that can promote health and wellness. This includes:  A yearly physical exam. This is also called an annual wellness visit.  Regular dental and eye exams.  Immunizations.  Screening for certain conditions.  Healthy lifestyle choices, such as: ? Eating a healthy diet. ? Getting regular exercise. ? Not using drugs or products that contain nicotine and tobacco. ? Limiting alcohol use. What can I expect for my preventive care visit? Physical exam Your health care provider will check your:  Height and weight. These may be used to calculate your BMI (body mass index). BMI is a measurement that tells if you are at a healthy weight.  Heart rate and blood pressure.  Body temperature.  Skin for abnormal spots. Counseling Your health care provider may ask you questions about your:  Past medical problems.  Family's medical history.  Alcohol, tobacco, and drug use.  Emotional well-being.  Home life and relationship well-being.  Sexual activity.  Diet, exercise, and sleep habits.  Work and work Astron[employer].  Access to firearms. What immunizations do I need? Vaccines are usually given at various ages, according to a schedule. Your health care provider will recommend vaccines for you based on your age, medical history, and lifestyle or other factors, such as travel or where you work.   What tests do I need? Blood tests  Lipid and cholesterol levels. These may be checked every 5  years, or more often if you are over 71 years old.  Hepatitis C test.  Hepatitis B test. Screening  Lung cancer screening. You may have this screening every year starting at age 45 if you have a 30-pack-year history of smoking and currently smoke or have quit within the past 15 years.  Prostate cancer screening. Recommendations will vary depending on your family history and other risks.  Genital exam to check for testicular cancer or hernias.  Colorectal cancer screening. ? All adults should have this screening starting at age 54 and continuing until age 56. ? Your health care provider may recommend screening at age 3 if you are at increased risk. ? You will have tests every 1-10 years, depending on your results and the type of screening test.  Diabetes screening. ? This is done by checking your blood sugar (glucose) after you have not eaten for a while (fasting). ? You may have this done every 1-3 years.  STD (sexually transmitted disease) testing, if you are at risk. Follow these instructions at home: Eating and drinking  Eat a diet that includes fresh fruits and vegetables, whole grains, lean protein, and low-fat dairy products.  Take vitamin and mineral supplements as recommended by your health care provider.  Do not drink alcohol if your health care provider tells you not to drink.  If you drink alcohol: ? Limit how much you have to 0-2 drinks a day. ? Be aware of how much alcohol is in your drink. In the [place] one drink equals one 12  oz bottle of beer (355 mL), one 5 oz glass of wine (148 mL), or one 1 oz glass of hard liquor (44 mL).   Lifestyle  Take daily care of your teeth and gums. Brush your teeth every morning and night with fluoride toothpaste. Floss one time each day.  Stay active. Exercise for at least 30 minutes 5 or more days each week.  Do not use any products that contain nicotine or tobacco, such as cigarettes, e-cigarettes, and chewing tobacco. If you  need help quitting, ask your health care provider.  Do not use drugs.  If you are sexually active, practice safe sex. Use a condom or other form of protection to prevent STIs (sexually transmitted infections).  If told by your health care provider, take low-dose aspirin daily starting at age 31.  Find healthy ways to cope with stress, such as: ? Meditation, yoga, or listening to music. ? Journaling. ? Talking to a trusted person. ? Spending time with friends and family. Safety  Always wear your seat belt while driving or riding in a vehicle.  Do not drive: ? If you have been drinking alcohol. Do not ride with someone who has been drinking. ? When you are tired or distracted. ? While texting.  Wear a helmet and other protective equipment during sports activities.  If you have firearms in your house, make sure you follow all gun safety procedures. What's next?  Go to your health care provider once a year for an annual wellness visit.  Ask your health care provider how often you should have your eyes and teeth checked.  Stay up to date on all vaccines. This information is not intended to replace advice given to you by your health care provider. Make sure you discuss any questions you have with your health care provider. Document Revised: 02/04/2019 Document Reviewed: 05/02/2018 Elsevier Patient Education  2021 [employer].

## 2020-10-05 NOTE — Progress Notes (Signed)
Patient: Gregory Salinas, Male    DOB: 1961/02/19, 60 y.o.   MRN: [id number] Towanda Malkin, MD Visit Date: 10/05/2020  Today's Provider: Delsa Grana, PA-C   Chief Complaint  Patient presents with  . Annual Exam   Subjective:   Annual physical exam:  Gregory Salinas is a 60 y.o. male who presents today for health maintenance and annual & complete physical exam.   Exercise/Activity:  1 d week for maybe 30 min Diet/nutrition:   Overall healthy, cooks at home Sleep:  Sleeps well I suspect OSA probable with body habitus - pt notes wife is concerned with snoring and pauses in breathing - pt feels fine - well rested overall  ESS done today - score of 4 (see flow sheet)  Hypertension:  Currently managed on lisinopril-HCTZ 40-25 and amlodipine - increased doses last year  Pt reports good med compliance and denies any SE.   Blood pressure today is fairly well  Controlled - BP readings checked monthly in his clinic at work usually runs 130/80 and 128/80 BP Readings from Last 3 Encounters:  10/05/20 136/82  09/16/19 132/76  08/05/19 (!) 156/90   Pt denies CP, SOB, exertional sx, LE edema, palpitation, Ha's, visual disturbances, lightheadedness, hypotension, syncope. Dietary efforts for BP?  No low salt, not much exercise   Hyperlipidemia: Currently treated with simvastatin 80 mg for many years, pt reports good med compliance Last Lipids: Lab Results  Component Value Date   CHOL 179 09/16/2020   HDL 67 09/16/2020   LDLCALC 84 09/16/2020   TRIG 180 (A) 09/16/2020   - Denies: Chest pain, shortness of breath, myalgias, claudication   USPSTF grade A and B recommendations - reviewed and addressed today  Depression:  Phq 9 completed today by patient, was reviewed by me with patient in the room, score is negative, pt feels god PHQ 2/9 Scores 10/05/2020 09/16/2019 08/05/2019  PHQ - 2 Score 0 0 0  PHQ- 9 Score 0 0 1   Depression screen [facility] 2/9 10/05/2020 09/16/2019  08/05/2019  Decreased Interest 0 0 0  Down, Depressed, Hopeless 0 0 0  PHQ - 2 Score 0 0 0  Altered sleeping 0 0 1  Tired, decreased energy 0 0 0  Change in appetite 0 0 0  Feeling bad or failure about yourself  0 0 0  Trouble concentrating 0 0 0  Moving slowly or fidgety/restless 0 0 0  Suicidal thoughts 0 0 0  PHQ-9 Score 0 0 1  Difficult doing work/chores Not difficult at all Not difficult at all Not difficult at all   Hep C Screening: due STD testing and prevention (HIV/chl/gon/syphilis): due Intimate partner violence:denies    Labs brought in - will be abstracted PSA normal, no change from prior  Prostate cancer: PSA done by labs brought in by pt Prostate cancer screening with PSA: Discussed risks and benefits of PSA testing and provided handout. Pt decline to have PSA drawn today.  Lab Results  Component Value Date   PSA 1.2 01/31/2020    Urinary Symptoms:  IPSS Questionnaire (AUA-7):  IPSS    Row Name 10/05/20 1333         International Prostate Symptom Score   How often have you had the sensation of not emptying your bladder? Not at All     How often have you had to urinate less than every two hours? Not at All     How often have you found you stopped and  started again several times when you urinated? Not at All     How often have you found it difficult to postpone urination? Less than 1 in 5 times     How often have you had a weak urinary stream? Not at All     How often have you had to strain to start urination? Not at All     How many times did you typically get up at night to urinate? 2 Times     Total IPSS Score 3           Quality of Life due to urinary symptoms   If you were to spend the rest of your life with your urinary condition just the way it is now how would you feel about that? Pleased          taking HCTZ dose at bedtime   Advanced Care Planning:  A voluntary discussion about advance care planning including the explanation and discussion  of advance directives.  Discussed health care proxy and Living will, and the patient was able to identify a health care proxy as wife   Health Maintenance  Topic Date Due  . Hepatitis C Screening  Never done  . INFLUENZA VACCINE  12/20/2020  . COLONOSCOPY (Pts 45-90yr Insurance coverage will need to be confirmed)  02/04/2022  . TETANUS/TDAP  08/09/2027  . COVID-19 Vaccine  Completed  . HIV Screening  Completed  . HPV VACCINES  Aged Out    Skin cancer:   Pt reports no hx of skin cancer, suspicious lesions/biopsies in the past.  Colorectal cancer:  colonoscopy is UTD due 01/2022 -  Pt denies blood in stool, melena, change in bowels  Lung cancer:   Low Dose CT Chest recommended if Age 60-80years, 20 pack-year currently smoking OR have quit w/in 15years. Patient does not qualify.   Social History   Tobacco Use  . Smoking status: Never Smoker  . Smokeless tobacco: Never Used  Substance Use Topics  . Alcohol use: Never    [place] [facility] 0     AAA: N/AThe USPSTF recommends one-time screening with ultrasonography in men ages 666to 726years who have ever smoked  ECG:not indicated- no past ECGs to review in chart  Blood pressure/Hypertension: BP Readings from Last 3 Encounters:  10/05/20 136/82  09/16/19 132/76  08/05/19 (!) 156/90   Weight/Obesity: Wt Readings from Last 3 Encounters:  10/05/20 202 lb 6.4 oz (91.8 kg)  09/16/19 204 lb 1.6 oz (92.6 kg)  08/05/19 203 lb 1.6 oz (92.1 kg)   BMI Readings from Last 3 Encounters:  10/05/20 35.85 kg/m  09/16/19 36.15 kg/m  08/05/19 35.98 kg/m    Lipids:  Lab Results  Component Value Date   CHOL 161 01/31/2020   CHOL 161 07/24/2019   Lab Results  Component Value Date   HDL 60 01/31/2020   HDL 60 07/24/2019   Lab Results  Component Value Date   LDLCALC 73 01/31/2020   LDLCALC 73 07/24/2019   Lab Results  Component  Value Date   TRIG 180 (A) 01/31/2020   TRIG 237 (A) 07/24/2019   TRIG 237 (A) 07/24/2019   No results found for: CHOLHDL No results found for: LDLDIRECT Based on the results of lipid panel his/her cardiovascular risk factor ( using PRye)  in the next 10 years is : The 10-year ASCVD risk score (Mikey BussingDC JBrooke Bonito,  et al., 2013) is: 7.3%*   Values used to calculate the score:     Age: 35 years     Sex: Male     Is Non-Hispanic African American: No     Diabetic: No     Tobacco smoker: No     Systolic Blood Pressure: 903 mmHg     Is BP treated: Yes     HDL Cholesterol: 60 mg/dL*     Total Cholesterol: 161 mg/dL*     * - Cholesterol units were assumed for this score calculation Glucose:  No results found for: GLUCOSE, GLUCAP  Social History      He  reports that he has never smoked. He has never used smokeless tobacco. He reports that he does not drink alcohol and does not use drugs.       Social History   Socioeconomic History  . Marital status: Married    Spouse name: Not on file  . Number of children: 1  . Years of education: Not on file  . Highest education level: Doctorate  Occupational History  . Not on file  Tobacco Use  . Smoking status: Never Smoker  . Smokeless tobacco: Never Used  Vaping Use  . Vaping Use: Never used  Substance and Sexual Activity  . Alcohol use: Never  . Drug use: Never  . Sexual activity: Yes  Other Topics Concern  . Not on file  Social History Narrative  . Not on file   Social Determinants of Health   Financial Resource Strain: Low Risk   . Difficulty of Paying Living Expenses: Not hard at all  Food Insecurity: No Food Insecurity  . Worried About Charity fundraiser in the Last Year: Never true  . Ran Out of Food in the Last Year: Never true  Transportation Needs: No Transportation Needs  . Lack of Transportation (Medical): No  . Lack of Transportation (Non-Medical): No  Physical Activity: Insufficiently Active  . Days of  Exercise per Week: 1 day  . Minutes of Exercise per Session: 30 min  Stress: No Stress Concern Present  . Feeling of Stress : Only a little  Social Connections: Socially Integrated  . Frequency of Communication with Friends and Family: Twice a week  . Frequency of Social Gatherings with Friends and Family: Twice a week  . Attends Religious Services: More than 4 times per year  . Active Member of Clubs or Organizations: Yes  . Attends Archivist Meetings: More than 4 times per year  . Marital Status: Married     Family History        No family status information on file.        His family history is not on file.      History reviewed. No pertinent family history.  Patient Active Problem List   Diagnosis Date Noted  . Low testosterone 10/05/2020  . Essential hypertension 08/05/2019  . Mixed hyperlipidemia 08/05/2019  . Class 2 severe obesity due to excess calories with serious comorbidity and body mass index (BMI) of 35.0 to 35.9 in adult ([place]) 08/05/2019  . Prediabetes 08/05/2019  . Neuropathy of right hand 08/05/2019    History reviewed. No pertinent surgical history.   Current Outpatient Medications:  .  amLODipine (NORVASC) 10 MG tablet, Take 1 tablet (10 mg total) by mouth daily., Disp: 30 tablet, Rfl: 0 .  Ascorbic Acid (VITAMIN C) 1000 MG tablet, Take 1,000 mg by mouth daily., Disp: , Rfl:  .  aspirin 81 MG chewable tablet, , Disp: , Rfl:  .  B Complex-C (SUPER B COMPLEX PO), , Disp: , Rfl:  .  Cholecalciferol (HM VITAMIN D3) 25 MCG (1000 UT) tablet, Take 1,000 Units by mouth daily., Disp: , Rfl:  .  cyclobenzaprine (FLEXERIL) 10 MG tablet, Take 10 mg by mouth 3 (three) times daily as needed for muscle spasms., Disp: , Rfl:  .  Doxylamine Succinate, Sleep, (SLEEP AID PO), Take 10 mg by mouth. [place], Disp: , Rfl:  .  Glucosamine-Chondroitin-MSM 1500-1200-500 MG PACK, Take by mouth., Disp: , Rfl:  .  lisinopril-hydrochlorothiazide (ZESTORETIC) 20-12.5  MG tablet, Take 2 tablets by mouth daily., Disp: 60 tablet, Rfl: 0 .  meloxicam (MOBIC) 15 MG tablet, Take 15 mg by mouth as needed for pain. As need for knee pain, Disp: , Rfl:  .  Multiple Vitamins-Minerals (MULTIVITAMIN GUMMIES WOMENS PO), , Disp: , Rfl:  .  simvastatin (ZOCOR) 80 MG tablet, Take 1 tablet (80 mg total) by mouth daily., Disp: 30 tablet, Rfl: 0 .  vitamin E 180 MG (400 UNITS) capsule, Take 800 Units by mouth daily., Disp: , Rfl:   No Known Allergies  Patient Care Team: Towanda Malkin, MD as PCP - General (Internal Medicine)   Chart Review: I personally reviewed active problem list, medication list, allergies, family history, social history, health maintenance, notes from last encounter, lab results, imaging with the patient/caregiver today. Review past labs brought into clinic and in record pile, not yet abstracted, reviewed today with patient, reviewed old labs and last three OV with prior PCP and reviewed labs, med changes, VS with pt today   Review of Systems  Constitutional: Negative.   HENT: Negative.   Eyes: Negative.   Respiratory: Negative.   Cardiovascular: Negative.   Gastrointestinal: Negative.   Endocrine: Negative.   Genitourinary: Negative.   Musculoskeletal: Negative.   Skin: Negative.   Allergic/Immunologic: Negative.   Neurological: Negative.   Hematological: Negative.   Psychiatric/Behavioral: Negative.   All other systems reviewed and are negative.         Objective:   Vitals:  Vitals:   10/05/20 1328  BP: 136/82  Pulse: 90  Resp: 16  Temp: 98.1 F (36.7 C)  SpO2: 97%  Weight: 202 lb 6.4 oz (91.8 kg)  Height: _0  (1.6 m)    Body mass index is 35.85 kg/m.  Physical Exam Vitals and nursing note reviewed.  Constitutional:      General: He is not in acute distress.    Appearance: Normal appearance. He is obese. He is not ill-appearing, toxic-appearing or diaphoretic.  HENT:     Head: Normocephalic and atraumatic.      Right Ear: External ear normal.     Left Ear: External ear normal.  Eyes:     General:        Right eye: No discharge.        Left eye: No discharge.     Conjunctiva/sclera: Conjunctivae normal.  Cardiovascular:     Rate and Rhythm: Normal rate and regular rhythm.     Pulses: Normal pulses.     Heart sounds: Normal heart sounds. No murmur heard. No friction rub. No gallop.   Pulmonary:     Effort: Pulmonary effort is normal. No respiratory distress.     Breath sounds: Normal breath sounds. No stridor. No wheezing, rhonchi or rales.  Chest:     Chest wall: No tenderness.  Abdominal:     General: There  is no distension.  Skin:    General: Skin is dry.     Coloration: Skin is not jaundiced or pale.  Neurological:     Mental Status: He is alert. Mental status is at baseline.     Coordination: Coordination normal.     Gait: Gait normal.  Psychiatric:        Mood and Affect: Mood normal.        Behavior: Behavior normal.      No results found for this or any previous visit (from the past 2160 hour(s)).  Fall Risk: Fall Risk  10/05/2020 09/16/2019 08/05/2019  Falls in the past year? 0 0 0  Number falls in past yr: 0 0 0  Injury with Fall? 0 0 0  Follow up - Falls evaluation completed -    Functional Status Survey: Is the patient deaf or have difficulty hearing?: No Does the patient have difficulty seeing, even when wearing glasses/contacts?: No Does the patient have difficulty concentrating, remembering, or making decisions?: No Does the patient have difficulty walking or climbing stairs?: No Does the patient have difficulty dressing or bathing?: No Does the patient have difficulty doing errands alone such as visiting a doctor's office or shopping?: No   Assessment & Plan:    CPE completed today  . Prostate cancer screening and PSA options (with potential risks and benefits of testing vs not testing) were discussed along with recent recs/guidelines, shared decision  making and handout/information given to pt today - reviewed his labs he brought in - normal  . USPSTF grade A and B recommendations reviewed with patient; age-appropriate recommendations, preventive care, screening tests, etc discussed and encouraged; healthy living encouraged; see AVS for patient education given to patient  . Discussed importance of 150 minutes of physical activity weekly, AHA exercise recommendations given to pt in AVS/handout  . Discussed importance of healthy diet:  eating lean meats and proteins, avoiding trans fats and saturated fats, avoid simple sugars and excessive carbs in diet, eat 6 servings of fruit/vegetables daily and drink plenty of water and avoid sweet beverages.  DASH diet reviewed if pt has HTN  . Recommended pt to do annual eye exam and routine dental exams/cleanings  . Reviewed Health Maintenance: Health Maintenance  Topic Date Due  . Hepatitis C Screening  Never done  . INFLUENZA VACCINE  12/20/2020  . COLONOSCOPY (Pts 45-7yr Insurance coverage will need to be confirmed)  02/04/2022  . TETANUS/TDAP  08/09/2027  . COVID-19 Vaccine  Completed  . HIV Screening  Completed  . HPV VACCINES  Aged Out    . Immunizations: Immunization History  Administered Date(s) Administered  . Hepatitis A 01/29/2004  . Hepatitis B 10/10/1996  . IPV 08/08/1961, 10/12/1961, 05/10/1962, 05/22/1965  . Influenza-Unspecified 02/20/2019  . MMR 03/05/1985  . PFIZER(Purple Top)SARS-COV-2 Vaccination 07/30/2019, 08/20/2019, 03/18/2020  . Pneumococcal Conjugate-13 01/01/2020  . Pneumococcal Polysaccharide-23 01/28/2019  . Tdap 08/08/2017  . Varicella 05/23/1963    1. Annual physical exam Pt will do some of his needed screening labs with health/lab services available at work - HIV antibody (with reflex) - Hepatitis C Antibody - COMPLETE METABOLIC PANEL WITH GFR - Lipid panel - CBC w/Diff/Platelet - Hemoglobin A1c  2. Essential hypertension Not well controlled on  current meds - would need to add additional med since he is at max dose- trial of low salt diet, increase physical activity, recheck in 3-4 months - amLODipine (NORVASC) 10 MG tablet; Take 1 tablet (10 mg total) by mouth  daily.  Dispense: 90 tablet; Refill: 3 - COMPLETE METABOLIC PANEL WITH GFR - lisinopril-hydrochlorothiazide (ZESTORETIC) 20-12.5 MG tablet; Take 2 tablets by mouth daily.  Dispense: 180 tablet; Refill: 3  3. Mixed hyperlipidemia Well controlled, on statin, good compliance, no SE or concerns - simvastatin (ZOCOR) 80 MG tablet; Take 1 tablet (80 mg total) by mouth daily.  Dispense: 90 tablet; Refill: 3 - Lipid panel  4. Class 2 severe obesity due to excess calories with serious comorbidity and body mass index (BMI) of 35.0 to 35.9 in adult ([facility]) No change in weight - offered med weight management, dietician referral - COMPLETE METABOLIC PANEL WITH GFR - Lipid panel  5. Encounter for hepatitis C screening test for low risk patient due - Hepatitis C Antibody  6. Screening for HIV without presence of risk factors due - HIV antibody (with reflex)  7. Screening for prostate cancer PSA done with employee labs? Stable, in normal range  8. Prediabetes Last A1C 6.1, stable, advised continued healthy diet, increase physical activity avoid sweets/excessive simple carbs - Hemoglobin A1c  9. Low testosterone Last two readings decreasing - recommend repeat labs and f/up with urology for further eval and management - Testosterone Review info on low T, repeat lab and pt will decide if referral is needed    10. Suspected sleep apnea  R29.818 Ambulatory referral to Sleep Studies   see below - may be related to weight/HTN? recent dose increased - discussed stress and impact on heart health - ordered - pt feels good, cost may limit   11. Snoring  R06.83 Ambulatory referral to Sleep Studies   offered sleep study order - pts energy is good, ESS low score   12. Elevated LFTs  R79.89     mild - discussed weight loss, avoiding processed foods may help, if any increase work up elevated LFTs Lab Results  Component Value Date   ALT 59 (A) 09/16/2020   AST 36 09/16/2020   ALKPHOS 53 09/16/2020   AST and ALT high on lab reference ranges     Delsa Grana, PA-C 10/05/20 1:31 PM  [facility]

## 2020-10-08 LAB — BASIC METABOLIC PANEL
BUN: 16 (ref 4–21)
CO2: 22 (ref 13–22)
Chloride: 98 — AB (ref 99–108)
Creatinine: 0.8 (ref 0.6–1.3)
Glucose: 104
Potassium: 3.8 (ref 3.4–5.3)
Sodium: 135 — AB (ref 137–147)

## 2020-10-08 LAB — LIPID PANEL
Cholesterol: 159 (ref 0–200)
HDL: 59 (ref 35–70)
LDL Cholesterol: 72
LDl/HDL Ratio: 2.7

## 2020-10-08 LAB — TSH: TSH: 1.45 (ref 0.41–5.90)

## 2020-10-08 LAB — HEMOGLOBIN A1C: Hemoglobin A1C: 6.1

## 2020-10-08 LAB — COMPREHENSIVE METABOLIC PANEL
Albumin: 1.5 — AB (ref 3.5–5.0)
Calcium: 9.4 (ref 8.7–10.7)
Globulin: 2.8

## 2020-10-08 LAB — HIV ANTIBODY (ROUTINE TESTING W REFLEX): HIV 1&2 Ab, 4th Generation: NEGATIVE

## 2020-10-08 LAB — CBC AND DIFFERENTIAL
HCT: 44 (ref 41–53)
Hemoglobin: 14.1 (ref 13.5–17.5)
Platelets: 242 (ref 150–399)
WBC: 5.8

## 2020-10-08 LAB — CBC: RBC: 4.85 (ref 3.87–5.11)

## 2020-10-08 LAB — TESTOSTERONE: Testosterone: 206

## 2020-10-08 LAB — PSA: PSA: 1.15

## 2020-10-08 LAB — VITAMIN D 25 HYDROXY (VIT D DEFICIENCY, FRACTURES): Vit D, 25-Hydroxy: 58

## 2020-11-11 ENCOUNTER — Encounter: Payer: Self-pay | Admitting: Internal Medicine

## 2021-03-16 LAB — BASIC METABOLIC PANEL
BUN: 14 (ref 4–21)
CO2: 25 — AB (ref 13–22)
Chloride: 95 — AB (ref 99–108)
Creatinine: 0.8 (ref <=1.3)
Glucose: 106
Potassium: 3.7 (ref 3.4–5.3)
Sodium: 135 — AB (ref 137–147)

## 2021-03-16 LAB — LIPID PANEL
Cholesterol: 171 (ref 0–200)
HDL: 66 (ref 35–70)
LDL Cholesterol: 75
LDl/HDL Ratio: 2.6
Triglycerides: 207 — AB (ref 40–160)

## 2021-03-16 LAB — CBC: RBC: 5.97 — AB (ref 3.87–5.11)

## 2021-03-16 LAB — HEPATIC FUNCTION PANEL
ALT: 34 (ref 10–40)
AST: 25 (ref 14–40)
Alkaline Phosphatase: 63 (ref 25–125)
Bilirubin, Total: 0.7

## 2021-03-16 LAB — CBC AND DIFFERENTIAL
HCT: 52 (ref 41–53)
Hemoglobin: 16.5 (ref 13.5–17.5)
Platelets: 238 (ref 150–399)
WBC: 4.7

## 2021-03-16 LAB — COMPREHENSIVE METABOLIC PANEL
Albumin: 4.7 (ref 3.5–5.0)
Calcium: 9.8 (ref 8.7–10.7)
Globulin: 3.1

## 2021-03-16 LAB — HEMOGLOBIN A1C: Hemoglobin A1C: 6

## 2021-03-16 LAB — VITAMIN D 25 HYDROXY (VIT D DEFICIENCY, FRACTURES): Vit D, 25-Hydroxy: 60

## 2021-03-16 LAB — TSH: TSH: 1.63 (ref 0.41–5.90)

## 2021-03-16 LAB — PSA: PSA: 1.53

## 2021-03-16 LAB — TESTOSTERONE: Testosterone: 267

## 2021-03-24 ENCOUNTER — Encounter: Payer: Self-pay | Admitting: Internal Medicine

## 2021-03-24 ENCOUNTER — Encounter: Payer: Self-pay | Admitting: Family Medicine

## 2021-04-21 ENCOUNTER — Encounter: Payer: Self-pay | Admitting: Internal Medicine

## 2021-09-21 ENCOUNTER — Other Ambulatory Visit: Payer: Self-pay | Admitting: Family Medicine

## 2021-09-21 DIAGNOSIS — I10 Essential (primary) hypertension: Secondary | ICD-10-CM

## 2021-10-19 ENCOUNTER — Telehealth: Payer: Self-pay | Admitting: Family Medicine

## 2021-10-19 DIAGNOSIS — I10 Essential (primary) hypertension: Secondary | ICD-10-CM

## 2021-10-19 NOTE — Telephone Encounter (Signed)
Lvm to inform that prescription has been sent to pharmacy asking that he return call to schedule appt

## 2021-10-19 NOTE — Telephone Encounter (Signed)
30 day supply given, pt needs APPT

## 2021-10-20 NOTE — Telephone Encounter (Signed)
Pt is calling to let Virl Axe know that his employer provider will take over these medications.

## 2021-10-21 NOTE — Telephone Encounter (Signed)
Pt needs appt before any refills can be given per providers

## 2021-10-21 NOTE — Telephone Encounter (Signed)
Per pt in early message states his employer will take over these medications.

## 2023-12-19 ENCOUNTER — Other Ambulatory Visit: Payer: Self-pay | Admitting: Medical Genetics

## 2023-12-24 ENCOUNTER — Other Ambulatory Visit
Admission: RE | Admit: 2023-12-24 | Discharge: 2023-12-24 | Disposition: A | Discharge status: Home / Self Care | Payer: Self-pay | Source: Ambulatory Visit | Attending: Medical Genetics | Admitting: Medical Genetics

## 2024-01-06 LAB — GENECONNECT MOLECULAR SCREEN: Genetic Analysis Overall Interpretation: NEGATIVE
